# Patient Record
Sex: Male | Born: 1975 | Hispanic: Yes | Marital: Single | Smoking: Never smoker
Health system: Southern US, Community
[De-identification: ages and names within clinical notes are randomized; demographics above are authoritative.]

## PROBLEM LIST (undated history)

## (undated) DIAGNOSIS — L409 Psoriasis, unspecified: Secondary | ICD-10-CM

## (undated) DIAGNOSIS — J302 Other seasonal allergic rhinitis: Secondary | ICD-10-CM

## (undated) DIAGNOSIS — K219 Gastro-esophageal reflux disease without esophagitis: Secondary | ICD-10-CM

## (undated) HISTORY — PX: HERNIA REPAIR: SHX51

## (undated) HISTORY — DX: Psoriasis, unspecified: L40.9

---

## 2011-08-05 ENCOUNTER — Emergency Department: Payer: Self-pay | Admitting: Emergency Medicine

## 2011-08-05 LAB — CBC WITH DIFFERENTIAL/PLATELET
Basophil #: 0 10*3/uL (ref 0.0–0.1)
Eosinophil %: 2.4 %
HGB: 17.5 g/dL (ref 13.0–18.0)
MCH: 32.1 pg (ref 26.0–34.0)
MCV: 94 fL (ref 80–100)
Monocyte #: 0.4 x10 3/mm (ref 0.2–1.0)
Monocyte %: 5.5 %
Neutrophil #: 4.4 10*3/uL (ref 1.4–6.5)
Neutrophil %: 65.8 %
Platelet: 280 10*3/uL (ref 150–440)
WBC: 6.8 10*3/uL (ref 3.8–10.6)

## 2011-08-05 LAB — COMPREHENSIVE METABOLIC PANEL
Albumin: 4.7 g/dL (ref 3.4–5.0)
Alkaline Phosphatase: 106 U/L (ref 50–136)
BUN: 11 mg/dL (ref 7–18)
Bilirubin,Total: 0.9 mg/dL (ref 0.2–1.0)
Chloride: 105 mmol/L (ref 98–107)
Co2: 29 mmol/L (ref 21–32)
Osmolality: 279 (ref 275–301)
Potassium: 3.6 mmol/L (ref 3.5–5.1)
SGPT (ALT): 56 U/L
Sodium: 140 mmol/L (ref 136–145)

## 2011-08-05 LAB — LIPASE, BLOOD: Lipase: 121 U/L (ref 73–393)

## 2012-11-02 ENCOUNTER — Ambulatory Visit: Payer: Self-pay | Admitting: Internal Medicine

## 2016-02-05 DIAGNOSIS — K219 Gastro-esophageal reflux disease without esophagitis: Secondary | ICD-10-CM | POA: Insufficient documentation

## 2020-01-25 ENCOUNTER — Emergency Department: Payer: 59

## 2020-01-25 ENCOUNTER — Emergency Department
Admission: EM | Admit: 2020-01-25 | Discharge: 2020-01-25 | Disposition: A | Discharge status: Home / Self Care | Payer: 59 | Attending: Emergency Medicine | Admitting: Emergency Medicine

## 2020-01-25 ENCOUNTER — Encounter: Payer: Self-pay | Admitting: *Deleted

## 2020-01-25 ENCOUNTER — Other Ambulatory Visit: Payer: Self-pay

## 2020-01-25 DIAGNOSIS — Z20822 Contact with and (suspected) exposure to covid-19: Secondary | ICD-10-CM | POA: Insufficient documentation

## 2020-01-25 DIAGNOSIS — R059 Cough, unspecified: Secondary | ICD-10-CM | POA: Diagnosis not present

## 2020-01-25 DIAGNOSIS — R42 Dizziness and giddiness: Secondary | ICD-10-CM | POA: Insufficient documentation

## 2020-01-25 DIAGNOSIS — R0602 Shortness of breath: Secondary | ICD-10-CM | POA: Diagnosis not present

## 2020-01-25 DIAGNOSIS — Z79899 Other long term (current) drug therapy: Secondary | ICD-10-CM | POA: Diagnosis not present

## 2020-01-25 HISTORY — DX: Other seasonal allergic rhinitis: J30.2

## 2020-01-25 HISTORY — DX: Gastro-esophageal reflux disease without esophagitis: K21.9

## 2020-01-25 LAB — URINALYSIS, COMPLETE (UACMP) WITH MICROSCOPIC
Bacteria, UA: NONE SEEN
Bilirubin Urine: NEGATIVE
Glucose, UA: NEGATIVE mg/dL
Hgb urine dipstick: NEGATIVE
Ketones, ur: NEGATIVE mg/dL
Leukocytes,Ua: NEGATIVE
Nitrite: NEGATIVE
Protein, ur: NEGATIVE mg/dL
Specific Gravity, Urine: 1.008 (ref 1.005–1.030)
Squamous Epithelial / HPF: NONE SEEN (ref 0–5)
pH: 8 (ref 5.0–8.0)

## 2020-01-25 LAB — CBC
HCT: 46.6 % (ref 39.0–52.0)
Hemoglobin: 15.9 g/dL (ref 13.0–17.0)
MCH: 31.9 pg (ref 26.0–34.0)
MCHC: 34.1 g/dL (ref 30.0–36.0)
MCV: 93.4 fL (ref 80.0–100.0)
Platelets: 306 10*3/uL (ref 150–400)
RBC: 4.99 MIL/uL (ref 4.22–5.81)
RDW: 12.2 % (ref 11.5–15.5)
WBC: 6.8 10*3/uL (ref 4.0–10.5)
nRBC: 0 % (ref 0.0–0.2)

## 2020-01-25 LAB — BASIC METABOLIC PANEL
Anion gap: 9 (ref 5–15)
BUN: 14 mg/dL (ref 6–20)
CO2: 27 mmol/L (ref 22–32)
Calcium: 9.4 mg/dL (ref 8.9–10.3)
Chloride: 104 mmol/L (ref 98–111)
Creatinine, Ser: 1.35 mg/dL — ABNORMAL HIGH (ref 0.61–1.24)
GFR, Estimated: >60 mL/min (ref >60)
Glucose, Bld: 105 mg/dL — ABNORMAL HIGH (ref 70–99)
Potassium: 4 mmol/L (ref 3.5–5.1)
Sodium: 140 mmol/L (ref 135–145)

## 2020-01-25 LAB — GROUP A STREP BY PCR: Group A Strep by PCR: NOT DETECTED

## 2020-01-25 LAB — RESPIRATORY PANEL BY RT PCR (FLU A&B, COVID)
Influenza A by PCR: NEGATIVE
Influenza B by PCR: NEGATIVE
SARS Coronavirus 2 by RT PCR: NEGATIVE

## 2020-01-25 MED ORDER — AZITHROMYCIN 250 MG PO TABS: ORAL_TABLET | ORAL | 0 refills | Status: AC

## 2020-01-25 MED ORDER — DM-GUAIFENESIN ER 30-600 MG PO TB12: 1.0000 | ORAL_TABLET | Freq: Two times a day (BID) | ORAL | 0 refills | Status: DC

## 2020-01-25 MED ORDER — HYDROXYZINE HCL 10 MG PO TABS: 10.0000 mg | ORAL_TABLET | Freq: Three times a day (TID) | ORAL | 0 refills | Status: AC | PRN

## 2020-01-25 MED ORDER — FAMOTIDINE 20 MG PO TABS: 20.0000 mg | ORAL_TABLET | Freq: Two times a day (BID) | ORAL | 1 refills | Status: DC

## 2020-01-25 MED ORDER — PREDNISONE 50 MG PO TABS: ORAL_TABLET | ORAL | 0 refills | Status: DC

## 2020-01-25 NOTE — ED Triage Notes (Signed)
Pt c/o cough x 2 wks. Today c/o n/v x 1 and dizziness. Pt c/o pain in bilateral ears stuffy nose and sore throat x 3 days.

## 2020-01-25 NOTE — ED Notes (Signed)
1930 Patient brought back to room 43, A&Ox4, c/o acid reflux symptoms worsening x 24 hours.  Has been taking omeprazole for same without improvement.  Has slight burning in back of throat, some nausea.  Denies eating anything to trigger symptoms.  See triage.

## 2020-01-25 NOTE — Discharge Instructions (Signed)
You have been prescribed azithromycin, prednisone and Mucinex DM for cough. You have been prescribed Pepcid for acid reflux. You have been prescribed hydroxyzine for anxiety.

## 2020-01-25 NOTE — ED Provider Notes (Signed)
Emergency Department Provider Note  ____________________________________________  Time seen: Approximately 10:32 PM  I have reviewed the triage vital signs and the nursing notes.   HISTORY  Chief Complaint Cough and Dizziness   Historian Patient    HPI Clinton Zuniga is a 44 y.o. male presents to the emergency department with concern for cough for the past 2 weeks, pharyngitis and acid reflux. Patient is also requesting medication for anxiety. He denies chest pain, chest tightness or abdominal pain. No fever or chills at home. States that cough is keeping him awake at night. No sick contacts in the home.     Past Medical History:  Diagnosis Date  . Acid reflux   . Seasonal allergies      Immunizations up to date:  Yes.     Past Medical History:  Diagnosis Date  . Acid reflux   . Seasonal allergies     There are no problems to display for this patient.   Past Surgical History:  Procedure Laterality Date  . HERNIA REPAIR      Prior to Admission medications   Medication Sig Start Date End Date Taking? Authorizing Provider  azithromycin (ZITHROMAX Z-PAK) 250 MG tablet Take 2 tablets (500 mg) on  Day 1,  followed by 1 tablet (250 mg) once daily on Days 2 through 5. 01/25/20 01/30/20  Orvil Feil, PA-C  dextromethorphan-guaiFENesin [facility] DM) 30-600 MG 12hr tablet Take 1 tablet by mouth 2 (two) times daily. 01/25/20   Orvil Feil, PA-C  famotidine (PEPCID) 20 MG tablet Take 1 tablet (20 mg total) by mouth 2 (two) times daily for 10 days. 01/25/20 02/04/20  Orvil Feil, PA-C  hydrOXYzine (ATARAX/VISTARIL) 10 MG tablet Take 1 tablet (10 mg total) by mouth 3 (three) times daily as needed for up to 5 days. 01/25/20 01/30/20  Orvil Feil, PA-C  predniSONE (DELTASONE) 50 MG tablet Take one tablet daily for the next five days. 01/25/20   Orvil Feil, PA-C    Allergies Patient has no known allergies.  History reviewed. No pertinent family  history.  Social History Social History   Tobacco Use  . Smoking status: Never Smoker  . Smokeless tobacco: Never Used  Vaping Use  . Vaping Use: Never used  Substance Use Topics  . Alcohol use: Not Currently  . Drug use: Never     Review of Systems  Constitutional: No fever/chills Eyes:  No discharge ENT: No upper respiratory complaints. Respiratory: Patient has cough.  Gastrointestinal:   No nausea, no vomiting.  No diarrhea.  No constipation. Musculoskeletal: Negative for musculoskeletal pain. Skin: Negative for rash, abrasions, lacerations, ecchymosis.    ____________________________________________   PHYSICAL EXAM:  VITAL SIGNS: ED Triage Vitals  Enc Vitals Group     BP 01/25/20 1813 129/84     Pulse Rate 01/25/20 1813 80     Resp 01/25/20 1813 16     Temp 01/25/20 1813 98.1 F (36.7 C)     Temp Source 01/25/20 1813 Oral     SpO2 01/25/20 1813 99 %     Weight 01/25/20 1815 164 lb (74.4 kg)     Height 01/25/20 1815 5\' 1"  (1.549 m)     Head Circumference --      Peak Flow --      Pain Score 01/25/20 1814 4     Pain Loc --      Pain Edu? --      Excl. in GC? --  Constitutional: Alert and oriented. Patient is lying supine. Eyes: Conjunctivae are normal. PERRL. EOMI. Head: Atraumatic. ENT:      Ears: Tympanic membranes are mildly injected with mild effusion bilaterally.       Nose: No congestion/rhinnorhea.      Mouth/Throat: Mucous membranes are moist. Posterior pharynx is mildly erythematous.  Hematological/Lymphatic/Immunilogical: No cervical lymphadenopathy.  Cardiovascular: Normal rate, regular rhythm. Normal S1 and S2.  Good peripheral circulation. Respiratory: Normal respiratory effort without tachypnea or retractions. Lungs CTAB. Good air entry to the bases with no decreased or absent breath sounds. Gastrointestinal: Bowel sounds 4 quadrants. Soft and nontender to palpation. No guarding or rigidity. No palpable masses. No distention. No CVA  tenderness. Musculoskeletal: Full range of motion to all extremities. No gross deformities appreciated. Neurologic:  Normal speech and language. No gross focal neurologic deficits are appreciated.  Skin:  Skin is warm, dry and intact. No rash noted. Psychiatric: Mood and affect are normal. Speech and behavior are normal. Patient exhibits appropriate insight and judgement.    ____________________________________________   LABS (all labs ordered are listed, but only abnormal results are displayed)  Labs Reviewed  BASIC METABOLIC PANEL - Abnormal; Notable for the following components:      Result Value   Glucose, Bld 105 (*)    Creatinine, Ser 1.35 (*)    All other components within normal limits  URINALYSIS, COMPLETE (UACMP) WITH MICROSCOPIC - Abnormal; Notable for the following components:   Color, Urine STRAW (*)    APPearance CLEAR (*)    All other components within normal limits  GROUP A STREP BY PCR  RESPIRATORY PANEL BY RT PCR (FLU A&B, COVID)  CBC   ____________________________________________  EKG   ____________________________________________  RADIOLOGY Geraldo Pitter, personally viewed and evaluated these images (plain radiographs) as part of my medical decision making, as well as reviewing the written report by the radiologist.  DG Chest 2 View  Result Date: 01/25/2020 CLINICAL DATA:  Shortness of breath. EXAM: CHEST - 2 VIEW COMPARISON:  None. FINDINGS: The heart size and mediastinal contours are within normal limits. Both lungs are clear. The visualized skeletal structures are unremarkable. IMPRESSION: No active cardiopulmonary disease. Electronically Signed   By: Katherine Mantle M.D.   On: 01/25/2020 19:09    ____________________________________________    PROCEDURES  Procedure(s) performed:     Procedures     Medications - No data to display   ____________________________________________   INITIAL IMPRESSION / ASSESSMENT AND PLAN / ED  COURSE  Pertinent labs & imaging results that were available during my care of the patient were reviewed by me and considered in my medical decision making (see chart for details).      Assessment and Plan:  Bronchitis Acid reflux Anxiety 44 year old male presents to the emergency department with nonproductive cough for the past 2 weeks.  Vital signs were reassuring at triage. On physical exam, patient was calm, alert and nontoxic-appearing.  He had no increased work of breathing and no adventitious lung sounds were auscultated.  Patient was discharged with azithromycin and prednisone for likely bronchitis. He was discharged with hydroxyzine for anxiety and Pepcid for acid reflux. Return precautions were given to return with new or worsening symptoms. All patient questions were answered.  ____________________________________________  FINAL CLINICAL IMPRESSION(S) / ED DIAGNOSES  Final diagnoses:  Cough      NEW MEDICATIONS STARTED DURING THIS VISIT:  ED Discharge Orders         Ordered    predniSONE (  DELTASONE) 50 MG tablet        01/25/20 2023    azithromycin (ZITHROMAX Z-PAK) 250 MG tablet        01/25/20 2023    dextromethorphan-guaiFENesin (MUCINEX DM) 30-600 MG 12hr tablet  2 times daily        01/25/20 2023    hydrOXYzine (ATARAX/VISTARIL) 10 MG tablet  3 times daily PRN        01/25/20 2023    famotidine (PEPCID) 20 MG tablet  2 times daily        01/25/20 2024              This chart was dictated using voice recognition software/Dragon. Despite best efforts to proofread, errors can occur which can change the meaning. Any change was purely unintentional.     Orvil Feil, PA-C 01/25/20 2235    Shaune Pollack, MD 02/06/20 [phone]

## 2020-02-01 DIAGNOSIS — Z1389 Encounter for screening for other disorder: Secondary | ICD-10-CM | POA: Diagnosis not present

## 2020-02-01 DIAGNOSIS — J205 Acute bronchitis due to respiratory syncytial virus: Secondary | ICD-10-CM | POA: Diagnosis not present

## 2020-02-07 DIAGNOSIS — Z1389 Encounter for screening for other disorder: Secondary | ICD-10-CM | POA: Diagnosis not present

## 2020-02-07 DIAGNOSIS — Z113 Encounter for screening for infections with a predominantly sexual mode of transmission: Secondary | ICD-10-CM | POA: Diagnosis not present

## 2020-02-07 DIAGNOSIS — K219 Gastro-esophageal reflux disease without esophagitis: Secondary | ICD-10-CM | POA: Diagnosis not present

## 2020-02-07 DIAGNOSIS — Z23 Encounter for immunization: Secondary | ICD-10-CM | POA: Diagnosis not present

## 2020-02-07 DIAGNOSIS — L409 Psoriasis, unspecified: Secondary | ICD-10-CM | POA: Diagnosis not present

## 2020-02-07 DIAGNOSIS — Z1159 Encounter for screening for other viral diseases: Secondary | ICD-10-CM | POA: Diagnosis not present

## 2020-02-07 DIAGNOSIS — F411 Generalized anxiety disorder: Secondary | ICD-10-CM | POA: Diagnosis not present

## 2020-02-07 DIAGNOSIS — H8110 Benign paroxysmal vertigo, unspecified ear: Secondary | ICD-10-CM | POA: Diagnosis not present

## 2020-02-07 DIAGNOSIS — Z6828 Body mass index (BMI) 28.0-28.9, adult: Secondary | ICD-10-CM | POA: Diagnosis not present

## 2020-02-07 DIAGNOSIS — R0789 Other chest pain: Secondary | ICD-10-CM | POA: Diagnosis not present

## 2020-02-22 DIAGNOSIS — Z1389 Encounter for screening for other disorder: Secondary | ICD-10-CM | POA: Diagnosis not present

## 2020-02-22 DIAGNOSIS — F411 Generalized anxiety disorder: Secondary | ICD-10-CM | POA: Diagnosis not present

## 2020-03-03 DIAGNOSIS — R61 Generalized hyperhidrosis: Secondary | ICD-10-CM | POA: Diagnosis not present

## 2020-03-03 DIAGNOSIS — L409 Psoriasis, unspecified: Secondary | ICD-10-CM | POA: Diagnosis not present

## 2020-03-15 DIAGNOSIS — Z23 Encounter for immunization: Secondary | ICD-10-CM | POA: Diagnosis not present

## 2020-03-15 DIAGNOSIS — R7303 Prediabetes: Secondary | ICD-10-CM | POA: Diagnosis not present

## 2020-04-24 DIAGNOSIS — Z20822 Contact with and (suspected) exposure to covid-19: Secondary | ICD-10-CM | POA: Diagnosis not present

## 2020-04-25 DIAGNOSIS — Z20822 Contact with and (suspected) exposure to covid-19: Secondary | ICD-10-CM | POA: Diagnosis not present

## 2020-07-26 ENCOUNTER — Other Ambulatory Visit: Payer: Self-pay

## 2020-07-26 ENCOUNTER — Ambulatory Visit: Payer: 59 | Admitting: Dermatology

## 2020-07-26 ENCOUNTER — Encounter: Payer: Self-pay | Admitting: Dermatology

## 2020-07-26 DIAGNOSIS — L409 Psoriasis, unspecified: Secondary | ICD-10-CM

## 2020-07-26 DIAGNOSIS — B079 Viral wart, unspecified: Secondary | ICD-10-CM | POA: Diagnosis not present

## 2020-07-26 DIAGNOSIS — L74513 Primary focal hyperhidrosis, soles: Secondary | ICD-10-CM

## 2020-07-26 DIAGNOSIS — L74519 Primary focal hyperhidrosis, unspecified: Secondary | ICD-10-CM

## 2020-07-26 MED ORDER — CLOBETASOL PROPIONATE EMULSION 0.05 % EX FOAM: CUTANEOUS | 1 refills | Status: DC

## 2020-07-26 NOTE — Patient Instructions (Addendum)
Psoriasis is a chronic non-curable, but treatable genetic/hereditary disease that may have other systemic features affecting other organ systems such as joints (Psoriatic Arthritis). It is associated with an increased risk of inflammatory bowel disease, heart disease, non-alcoholic fatty liver disease, and depression.      Side effects of Otezla (apremilast) include diarrhea, nausea, headache, upper respiratory infection, depression, and weight decrease (5-10%). It should only be taken by pregnant women after a discussion regarding risks and benefits with their doctor. Goal is control of skin condition, not cure.  The use of [place] requires long term medication management, including periodic office visits.   If you have any questions or concerns for your doctor, please call our main line at [phone] and press option 4 to reach your doctor's medical assistant. If no one answers, please leave a voicemail as directed and we will return your call as soon as possible. Messages left after 4 pm will be answered the following business day.   You may also send [place] a message via MyChart. We typically respond to MyChart messages within 1-2 business days.  For prescription refills, please ask your pharmacy to contact our office. Our fax number is [phone].  If you have an urgent issue when the clinic is closed that cannot wait until the next business day, you can page your doctor at the number below.    Please note that while we do our best to be available for urgent issues outside of office hours, we are not available 24/7.   If you have an urgent issue and are unable to reach [place], you may choose to seek medical care at your doctor's office, retail clinic, urgent care center, or emergency room.  If you have a medical emergency, please immediately call 911 or go to the emergency department.  Pager Numbers  - Dr. Gwen Pounds: [phone]  - Dr. Neale Burly: [phone]  - Dr. Roseanne Reno: [phone]  In  the event of inclement weather, please call our main line at [phone] for an update on the status of any delays or closures.  Dermatology Medication Tips: Please keep the boxes that topical medications come in in order to help keep track of the instructions about where and how to use these. Pharmacies typically print the medication instructions only on the boxes and not directly on the medication tubes.   If your medication is too expensive, please contact our office at [phone] option 4 or send [place] a message through MyChart.   We are unable to tell what your co-pay for medications will be in advance as this is different depending on your insurance coverage. However, we may be able to find a substitute medication at lower cost or fill out paperwork to get insurance to cover a needed medication.   If a prior authorization is required to get your medication covered by your insurance company, please allow [place] 1-2 business days to complete this process.  Drug prices often vary depending on where the prescription is filled and some pharmacies may offer cheaper prices.  The website www.goodrx.com contains coupons for medications through different pharmacies. The prices here do not account for what the cost may be with help from insurance (it may be cheaper with your insurance), but the website can give you the price if you did not use any insurance.  - You can print the associated coupon and take it with your prescription to the pharmacy.  - You may also stop by our office during regular business hours and pick up  a GoodRx coupon card.  - If you need your prescription sent electronically to a different pharmacy, notify our office through [facility] or by phone at [phone] option 4.    Try Carpe Lotion daily to feet or Zero Sweat daily to feet. If no improvement can consider prescription.   Cantharidin Plus is a blistering agent that comes from a beetle.  It needs to be washed off  in about 4 hours after application.  Although it is painless when applied in office, it may cause symptoms of mild pain and burning several hours later.  Treated areas will swell and turn red, and blisters may form.  Vaseline and a bandaid may be applied until wound has healed.  Once healed, the skin may remain temporarily discolored.  It can take weeks to months for pigmentation to return to normal.  Advised to wash off with soap and water in 4 hours or sooner if it becomes tender before then.

## 2020-07-26 NOTE — Progress Notes (Signed)
New Patient Visit  Subjective  Clinton Zuniga is a 45 y.o. male who presents for the following: Psoriasis (New patient is here today for psoriasis. He states he has been dealing with condition for about 7 years. He currently using clobetasol solution and triamcinolone cream to help. He denies that cream and solution is helping. He reports he is having some back pain. Patient states he has areas on scalp, back, arms, legs, and abdomen. ).  Patient would also like to discuss mole on left side near nose. New in last few months.  He would also like to discuss excess sweating of feet for years. He has tried OTC antifungals and some other product but does not know the name.   Objective  Well appearing patient in no apparent distress; mood and affect are within normal limits.  A focused examination was performed including face, neck, chest, abdomen, back, arms, legs, scalp. Relevant physical exam findings are noted in the Assessment and Plan.  Objective  back, buttocks, scalp, arms, abdomen, and legs: Thick scaly plaques on back, buttocks, scalp, arms, abdomen, and legs   Objective  Left Buccal Cheek: Verrucous papules -- Discussed viral etiology and contagion.   Assessment & Plan  Psoriasis back, buttocks, scalp, arms, abdomen, and legs  Psoriasis is a chronic non-curable, but treatable genetic/hereditary disease that may have other systemic features affecting other organ systems such as joints (Psoriatic Arthritis). It is associated with an increased risk of inflammatory bowel disease, heart disease, non-alcoholic fatty liver disease, and depression.    Chronic condition with duration over one year. Condition is bothersome to patient. Currently flared.  Denies joint pain today  Has history of depression on Sertraline and doing well.   Difficult to come for phototherapy multiple times a week, will start [place] since he has failed topical steroids and has BSA approx  20%.  Side effects of Otezla (apremilast) include diarrhea, nausea, headache, upper respiratory infection, depression, and weight decrease (5-10%). It should only be taken by pregnant women after a discussion regarding risks and benefits with their doctor. Goal is control of skin condition, not cure.  The use of [place] requires long term medication management, including periodic office visits.  Submit for approval will start with 2 sample packs today    [id number] 28 Sep 2021  Also start clobetasol foam to affected areas apply twice daily as needed to affected areas for two weeks then apply twice daily on weekends only. Avoid applying to face, groin, and axilla. Use as directed. Risk of skin atrophy with long-term use reviewed.   Topical steroids (such as triamcinolone, fluocinolone, fluocinonide, mometasone, clobetasol, halobetasol, betamethasone, hydrocortisone) can cause thinning and lightening of the skin if they are used for too long in the same area. Your physician has selected the right strength medicine for your problem and area affected on the body. Please use your medication only as directed by your physician to prevent side effects.        Viral warts, unspecified type Left Buccal Cheek  Discussed treatment options  Discussed viral etiology and risk of spread.  Discussed multiple treatments may be required to clear warts.  Discussed possible post-treatment dyspigmentation and risk of recurrence.   Cantharidin Plus is a blistering agent that comes from a beetle.  It needs to be washed off in about 4 hours after application.  Although it is painless when applied in office, it may cause symptoms of mild pain and burning several hours later.  Treated areas  will swell and turn red, and blisters may form.  Vaseline and a bandaid may be applied until wound has healed.  Once healed, the skin may remain temporarily discolored.  It can take weeks to months for pigmentation to return to  normal.  Advised to wash off with soap and water in 4 hours or sooner if it becomes tender before then.    Destruction of lesion - Left Buccal Cheek  Destruction method: chemical removal   Informed consent: discussed and consent obtained   Timeout:  patient name, date of birth, surgical site, and procedure verified Chemical destruction method: cantharidin   Chemical destruction method comment:  Plus Application time:  4 hours Procedure instructions: patient instructed to wash and dry area   Outcome: patient tolerated procedure well with no complications   Post-procedure details: wound care instructions given    Primary focal hyperhidrosis Left Foot - Anterior  Recommend trial of Carpe lotion and/or Zero Sweat.   Will consider qbrexza if doesn't respond to carpe lotion or zero sweat   Return in about 6 weeks (around 09/06/2020) for psoriasis .  I, Asher Muir, CMA, am acting as scribe for Darden Dates, MD.  Documentation: I have reviewed the above documentation for accuracy and completeness, and I agree with the above.  Darden Dates, MD

## 2020-07-27 MED ORDER — OTEZLA 30 MG PO TABS: 30.0000 mg | ORAL_TABLET | Freq: Two times a day (BID) | ORAL | 5 refills | Status: DC

## 2020-07-27 MED ORDER — OTEZLA 30 MG PO TABS: 30.0000 mg | ORAL_TABLET | Freq: Two times a day (BID) | ORAL | 12 refills | Status: DC

## 2020-07-27 NOTE — Addendum Note (Signed)
Addended by: Asher Muir A on: 07/27/2020 08:09 AM   Modules accepted: Orders

## 2020-09-07 ENCOUNTER — Other Ambulatory Visit: Payer: Self-pay

## 2020-09-07 ENCOUNTER — Ambulatory Visit: Payer: 59 | Admitting: Dermatology

## 2020-09-07 DIAGNOSIS — L409 Psoriasis, unspecified: Secondary | ICD-10-CM

## 2020-09-07 NOTE — Patient Instructions (Addendum)
Continue clobetasol twice daily on weekends only. Avoid applying to face, groin, and axilla. Use as directed. Risk of skin atrophy with long-term use reviewed.  Discontinue triamcinolone.   Topical steroids (such as triamcinolone, fluocinolone, fluocinonide, mometasone, clobetasol, halobetasol, betamethasone, hydrocortisone) can cause thinning and lightening of the skin if they are used for too long in the same area. Your physician has selected the right strength medicine for your problem and area affected on the body. Please use your medication only as directed by your physician to prevent side effects.    If you have any questions or concerns for your doctor, please call our main line at [phone] and press option 4 to reach your doctor's medical assistant. If no one answers, please leave a voicemail as directed and we will return your call as soon as possible. Messages left after 4 pm will be answered the following business day.   You may also send [place] a message via MyChart. We typically respond to MyChart messages within 1-2 business days.  For prescription refills, please ask your pharmacy to contact our office. Our fax number is [phone].  If you have an urgent issue when the clinic is closed that cannot wait until the next business day, you can page your doctor at the number below.    Please note that while we do our best to be available for urgent issues outside of office hours, we are not available 24/7.   If you have an urgent issue and are unable to reach [place], you may choose to seek medical care at your doctor's office, retail clinic, urgent care center, or emergency room.  If you have a medical emergency, please immediately call 911 or go to the emergency department.  Pager Numbers  - Dr. Gwen Pounds: [phone]  - Dr. Neale Burly: [phone]  - Dr. Roseanne Reno: [phone]  In the event of inclement weather, please call our main line at [phone] for an update on the status of  any delays or closures.  Dermatology Medication Tips: Please keep the boxes that topical medications come in in order to help keep track of the instructions about where and how to use these. Pharmacies typically print the medication instructions only on the boxes and not directly on the medication tubes.   If your medication is too expensive, please contact our office at [phone] option 4 or send [place] a message through MyChart.   We are unable to tell what your co-pay for medications will be in advance as this is different depending on your insurance coverage. However, we may be able to find a substitute medication at lower cost or fill out paperwork to get insurance to cover a needed medication.   If a prior authorization is required to get your medication covered by your insurance company, please allow [place] 1-2 business days to complete this process.  Drug prices often vary depending on where the prescription is filled and some pharmacies may offer cheaper prices.  The website www.goodrx.com contains coupons for medications through different pharmacies. The prices here do not account for what the cost may be with help from insurance (it may be cheaper with your insurance), but the website can give you the price if you did not use any insurance.  - You can print the associated coupon and take it with your prescription to the pharmacy.  - You may also stop by our office during regular business hours and pick up a GoodRx coupon card.  - If you need your prescription sent  electronically to a different pharmacy, notify our office through [facility] or by phone at [phone] option 4.

## 2020-09-07 NOTE — Progress Notes (Signed)
   Follow-Up Visit   Subjective  Clinton Zuniga is a 45 y.o. male who presents for the following: Psoriasis (Patient here today for 6 week psoriasis follow up. He started [place] samples but is only taking one a day at bedtime due to nausea. Patient has not picked up clobetasol foam yet and is using clobetasol solution and TMC 0.1% cream that he already had. ).  Patient thinks psoriasis may have improved a little.   The following portions of the chart were reviewed this encounter and updated as appropriate:   Tobacco  Allergies  Meds  Problems  Med Hx  Surg Hx  Fam Hx       Review of Systems:  No other skin or systemic complaints except as noted in HPI or Assessment and Plan.  Objective  Well appearing patient in no apparent distress; mood and affect are within normal limits.  A focused examination was performed including face, neck, chest and back and arms. Relevant physical exam findings are noted in the Assessment and Plan.  Abdomen (Lower Torso, Anterior) Widespread scaly pink plaques chest, back, arms  BSA > 10%   Assessment & Plan  Psoriasis Abdomen (Lower Torso, Anterior)  Chronic condition with duration over one year. Condition is bothersome to patient. Not currently at goal.  Continue clobetasol twice daily on weekends. Avoid applying to face, groin, and axilla. Use as directed. Risk of skin atrophy with long-term use reviewed.  D/c TMC 0.1%  D/C Otezla due to side effects  Stat NVUVB twice weekly. If too expensive will submit for [facility].   Topical steroids (such as triamcinolone, fluocinolone, fluocinonide, mometasone, clobetasol, halobetasol, betamethasone, hydrocortisone) can cause thinning and lightening of the skin if they are used for too long in the same area. Your physician has selected the right strength medicine for your problem and area affected on the body. Please use your medication only as directed by your physician to prevent side effects.    Patient treated in lightbox for 2 minutes, 20 seconds.   Related Procedures Phototherapy Treatment  Return in about 3 months (around 12/08/2020) for Psoriasis.  Anise Salvo, RMA, am acting as scribe for Darden Dates, MD .  Documentation: I have reviewed the above documentation for accuracy and completeness, and I agree with the above.  Darden Dates, MD

## 2020-09-20 ENCOUNTER — Encounter: Payer: Self-pay | Admitting: Dermatology

## 2021-01-03 ENCOUNTER — Other Ambulatory Visit: Payer: Self-pay

## 2021-01-03 ENCOUNTER — Ambulatory Visit: Payer: 59 | Admitting: Dermatology

## 2021-01-03 DIAGNOSIS — L409 Psoriasis, unspecified: Secondary | ICD-10-CM

## 2021-01-03 MED ORDER — HYDROXYZINE HCL 25 MG PO TABS: ORAL_TABLET | ORAL | 0 refills | Status: DC

## 2021-01-03 MED ORDER — VTAMA 1 % EX CREA: 1.0000 "application " | TOPICAL_CREAM | Freq: Every day | CUTANEOUS | 2 refills | Status: DC

## 2021-01-03 MED ORDER — WYNZORA 0.005-0.064 % EX CREA: 1.0000 "application " | TOPICAL_CREAM | Freq: Every day | CUTANEOUS | 1 refills | Status: DC

## 2021-01-03 NOTE — Patient Instructions (Signed)
Reviewed risks of biologics including immunosuppression, infections, injection site reaction, and failure to improve condition. Goal is control of skin condition, not cure.  Some older biologics such as Humira and Enbrel may slightly increase risk of malignancy and may worsen congestive heart failure. The use of biologics requires long term medication management, including periodic office visits and monitoring of blood work.    If you have any questions or concerns for your doctor, please call our main line at [phone] and press option 4 to reach your doctor's medical assistant. If no one answers, please leave a voicemail as directed and we will return your call as soon as possible. Messages left after 4 pm will be answered the following business day.   You may also send us a message via MyChart. We typically respond to MyChart messages within 1-2 business days.  For prescription refills, please ask your pharmacy to contact our office. Our fax number is [phone].  If you have an urgent issue when the clinic is closed that cannot wait until the next business day, you can page your doctor at the number below.    Please note that while we do our best to be available for urgent issues outside of office hours, we are not available 24/7.   If you have an urgent issue and are unable to reach us, you may choose to seek medical care at your doctor's office, retail clinic, urgent care center, or emergency room.  If you have a medical emergency, please immediately call 911 or go to the emergency department.  Pager Numbers  - Dr. Kowalski: [phone]  - Dr. Moye: [phone]  - Dr. Stewart: [phone]  In the event of inclement weather, please call our main line at [phone] for an update on the status of any delays or closures.  Dermatology Medication Tips: Please keep the boxes that topical medications come in in order to help keep track of the instructions about where and how to  use these. Pharmacies typically print the medication instructions only on the boxes and not directly on the medication tubes.   If your medication is too expensive, please contact our office at [phone] option 4 or send us a message through MyChart.   We are unable to tell what your co-pay for medications will be in advance as this is different depending on your insurance coverage. However, we may be able to find a substitute medication at lower cost or fill out paperwork to get insurance to cover a needed medication.   If a prior authorization is required to get your medication covered by your insurance company, please allow us 1-2 business days to complete this process.  Drug prices often vary depending on where the prescription is filled and some pharmacies may offer cheaper prices.  The website www.goodrx.com contains coupons for medications through different pharmacies. The prices here do not account for what the cost may be with help from insurance (it may be cheaper with your insurance), but the website can give you the price if you did not use any insurance.  - You can print the associated coupon and take it with your prescription to the pharmacy.  - You may also stop by our office during regular business hours and pick up a GoodRx coupon card.  - If you need your prescription sent electronically to a different pharmacy, notify our office through [facility] MyChart or by phone at [phone] option 4.  

## 2021-01-03 NOTE — Progress Notes (Signed)
   Follow-Up Visit   Subjective  Clinton Zuniga is a 45 y.o. male who presents for the following: Psoriasis (Patient here today for 3 month psoriasis follow up. Patient was not able to tolerate Otezla due to side effects. Patient has used TMC 0.1% cream and clobetasol but neither has helped. Patient advises his psoriasis has been extremely itchy, is spreading and keeps him awake.).   The following portions of the chart were reviewed this encounter and updated as appropriate:   Tobacco  Allergies  Meds  Problems  Med Hx  Surg Hx  Fam Hx      Review of Systems:  No other skin or systemic complaints except as noted in HPI or Assessment and Plan.  Objective  Well appearing patient in no apparent distress; mood and affect are within normal limits.  A focused examination was performed including face, neck, chest and back and arms, legs. Relevant physical exam findings are noted in the Assessment and Plan.  trunk, extremities Widespread, moderately thick, scaly pink plaques over trunk and extremities with scalp and face involvement    Assessment & Plan  Psoriasis trunk, extremities  Chronic condition with duration or expected duration over one year. Condition is bothersome to patient. Currently flared.  Psoriasis is a chronic non-curable, but treatable genetic/hereditary disease that may have other systemic features affecting other organ systems such as joints (Psoriatic Arthritis). It is associated with an increased risk of inflammatory bowel disease, heart disease, non-alcoholic fatty liver disease, and depression.    BSA 20%  Did not tolerate Otezla. No help with topical clobetasol and TMC. Unable to be approved for phototherapy.  Pending labs will submit for Skyrizi.  Start Vtama daily to affected areas. Samples x 2. Lot # [id number]  Exp: 12/2021 Start Eldred Manges daily to affected areas. Avoid applying to face, groin, and axilla. Use as directed. Risk of skin atrophy with  long-term use reviewed.  Samples x 2  Lot # [id number]  Exp: 05/2022  Topical steroids (such as triamcinolone, fluocinolone, fluocinonide, mometasone, clobetasol, halobetasol, betamethasone, hydrocortisone) can cause thinning and lightening of the skin if they are used for too long in the same area. Your physician has selected the right strength medicine for your problem and area affected on the body. Please use your medication only as directed by your physician to prevent side effects.      Tapinarof (VTAMA) 1 % CREA - trunk, extremities Apply 1 application topically daily.  Calcipotriene-Betameth Diprop [facility]) 0.005-0.064 % CREA - trunk, extremities Apply 1 application topically daily. Avoid applying to face, groin, and axilla. Use as directed. Risk of skin atrophy with long-term use reviewed.  hydrOXYzine (ATARAX/VISTARIL) 25 MG tablet - trunk, extremities 1-2 tabs every 6 hours as needed for itch. May make drowsy.  Related Procedures Comprehensive metabolic panel Lipid panel CBC with Differential/Platelet Hepatitis B surface antigen Hepatitis B surface antibody,qualitative Hepatitis C antibody HIV Antibody (routine testing w rflx) QuantiFERON-TB Gold Plus Hepatitis B core antibody, total  Return in about 5 weeks (around 02/07/2021) for Psoriasis.  Anise Salvo, RMA, am acting as scribe for Darden Dates, MD .  Documentation: I have reviewed the above documentation for accuracy and completeness, and I agree with the above.  Darden Dates, MD

## 2021-01-04 DIAGNOSIS — L409 Psoriasis, unspecified: Secondary | ICD-10-CM | POA: Diagnosis not present

## 2021-01-05 ENCOUNTER — Other Ambulatory Visit: Payer: Self-pay | Admitting: Dermatology

## 2021-01-05 DIAGNOSIS — L400 Psoriasis vulgaris: Secondary | ICD-10-CM

## 2021-01-05 LAB — COMPREHENSIVE METABOLIC PANEL
ALT: 37 IU/L (ref 0–44)
AST: 27 IU/L (ref 0–40)
Albumin/Globulin Ratio: 2 (ref 1.2–2.2)
Albumin: 5.1 g/dL — ABNORMAL HIGH (ref 4.0–5.0)
Alkaline Phosphatase: 105 IU/L (ref 44–121)
BUN/Creatinine Ratio: 15 (ref 9–20)
BUN: 17 mg/dL (ref 6–24)
Bilirubin Total: 0.6 mg/dL (ref 0.0–1.2)
CO2: 25 mmol/L (ref 20–29)
Calcium: 10 mg/dL (ref 8.7–10.2)
Chloride: 101 mmol/L (ref 96–106)
Creatinine, Ser: 1.17 mg/dL (ref 0.76–1.27)
Globulin, Total: 2.6 g/dL (ref 1.5–4.5)
Glucose: 90 mg/dL (ref 70–99)
Potassium: 4.7 mmol/L (ref 3.5–5.2)
Sodium: 141 mmol/L (ref 134–144)
Total Protein: 7.7 g/dL (ref 6.0–8.5)
eGFR: 78 mL/min/{1.73_m2} (ref >59)

## 2021-01-05 LAB — CBC WITH DIFFERENTIAL/PLATELET
Basophils Absolute: 0 10*3/uL (ref 0.0–0.2)
Basos: 1 %
EOS (ABSOLUTE): 0.3 10*3/uL (ref 0.0–0.4)
Eos: 5 %
Hematocrit: 47.6 % (ref 37.5–51.0)
Hemoglobin: 16.2 g/dL (ref 13.0–17.7)
Immature Grans (Abs): 0 10*3/uL (ref 0.0–0.1)
Immature Granulocytes: 1 %
Lymphocytes Absolute: 2 10*3/uL (ref 0.7–3.1)
Lymphs: 31 %
MCH: 31.6 pg (ref 26.6–33.0)
MCHC: 34 g/dL (ref 31.5–35.7)
MCV: 93 fL (ref 79–97)
Monocytes Absolute: 0.4 10*3/uL (ref 0.1–0.9)
Monocytes: 6 %
Neutrophils Absolute: 3.7 10*3/uL (ref 1.4–7.0)
Neutrophils: 56 %
Platelets: 341 10*3/uL (ref 150–450)
RBC: 5.12 x10E6/uL (ref 4.14–5.80)
RDW: 12.3 % (ref 11.6–15.4)
WBC: 6.5 10*3/uL (ref 3.4–10.8)

## 2021-01-05 LAB — HEPATITIS C ANTIBODY: Hep C Virus Ab: <0.1 s/co ratio (ref 0.0–0.9)

## 2021-01-05 LAB — LIPID PANEL
Chol/HDL Ratio: 5.2 ratio — ABNORMAL HIGH (ref 0.0–5.0)
Cholesterol, Total: 186 mg/dL (ref 100–199)
HDL: 36 mg/dL — ABNORMAL LOW (ref >39)
LDL Chol Calc (NIH): 112 mg/dL — ABNORMAL HIGH (ref 0–99)
Triglycerides: 215 mg/dL — ABNORMAL HIGH (ref 0–149)
VLDL Cholesterol Cal: 38 mg/dL (ref 5–40)

## 2021-01-05 LAB — HEPATITIS B SURFACE ANTIGEN: Hepatitis B Surface Ag: NEGATIVE

## 2021-01-05 LAB — HEPATITIS B SURFACE ANTIBODY,QUALITATIVE: Hep B Surface Ab, Qual: NONREACTIVE

## 2021-01-05 LAB — HIV ANTIBODY (ROUTINE TESTING W REFLEX): HIV Screen 4th Generation wRfx: NONREACTIVE

## 2021-01-05 NOTE — Progress Notes (Signed)
Resent lab orders to [facility]

## 2021-01-08 ENCOUNTER — Telehealth: Payer: Self-pay

## 2021-01-08 NOTE — Telephone Encounter (Signed)
-----   Message from Sandi Mealy, MD sent at 01/05/2021 11:37 AM EDT ----- Labs ok so far. However, there was an issue with electronic ordering and [facility] did not receive the second paget of the lab order which included hepatitis B core antibody and quantiferon gold tests, so unfortunately he will have to go back for another blood draw.   I did resend those two orders and confirm with [facility] that they received them.    He does have slightly elevated cholesterol and I recommend seeing primary care for this.  MAs please call and ask him to go back for the repeat lab draw. Thank you!

## 2021-01-11 ENCOUNTER — Telehealth: Payer: Self-pay

## 2021-01-11 NOTE — Telephone Encounter (Signed)
Left pt message to call back for lab results.

## 2021-01-15 ENCOUNTER — Encounter: Payer: Self-pay | Admitting: Dermatology

## 2021-01-15 ENCOUNTER — Telehealth: Payer: Self-pay | Admitting: Dermatology

## 2021-01-15 NOTE — Telephone Encounter (Signed)
Left voicemail asking patient to go by lab again to have additional bloodwork. Unfortunately [facility] did not receive the second page of the orders for bloodwork and so 2 tests were not done. I spoke with [facility] and resent the orders which they confirmed they received.

## 2021-01-16 DIAGNOSIS — L400 Psoriasis vulgaris: Secondary | ICD-10-CM | POA: Diagnosis not present

## 2021-01-21 LAB — QUANTIFERON-TB GOLD PLUS
QuantiFERON Mitogen Value: >10 IU/mL
QuantiFERON Nil Value: 0.2 IU/mL
QuantiFERON TB1 Ag Value: 3.2 IU/mL
QuantiFERON TB2 Ag Value: 3.04 IU/mL
QuantiFERON-TB Gold Plus: POSITIVE — AB

## 2021-01-21 LAB — HEPATITIS B CORE ANTIBODY, TOTAL: Hep B Core Total Ab: NEGATIVE

## 2021-01-24 ENCOUNTER — Telehealth: Payer: Self-pay

## 2021-01-24 NOTE — Telephone Encounter (Addendum)
Tried calling patient to advise him to go back and repeat lab draw. No answer and unable to leave message.     ----- Message from Sandi Mealy, MD sent at 01/05/2021 11:37 AM EDT ----- Labs ok so far. However, there was an issue with electronic ordering and [facility] did not receive the second paget of the lab order which included hepatitis B core antibody and quantiferon gold tests, so unfortunately he will have to go back for another blood draw.   I did resend those two orders and confirm with [facility] that they received them.    He does have slightly elevated cholesterol and I recommend seeing primary care for this.  MAs please call and ask him to go back for the repeat lab draw. Thank you!

## 2021-01-25 ENCOUNTER — Telehealth: Payer: Self-pay

## 2021-01-25 NOTE — Telephone Encounter (Signed)
-----   Message from Sandi Mealy, MD sent at 01/24/2021  1:44 PM EDT ----- Quantiferon gold test positive which means that patient may have caught the tuberculosis bacteria which can cause a lung infection. Tuberculosis can be active (causing symptoms like cough, night sweats, or fever) or latent (not causing any symptoms, but still present in the body and could become active, particularly if starting some medications like biologics for psoriasis).  He needs to go to the health department for further evaluation to determine if he has latent or active tuberculosis, and likely to start treatment to get rid of the tuberculosis bacteria before we can start a biologic for his psoriasis.   Completely aside from psoriasis treatment, I recommend he get this evaluated and treated if they find he does have tuberculosis to prevent active tuberculosis.  MAs please call with interpreter. Thank you!

## 2021-01-25 NOTE — Telephone Encounter (Signed)
Called patient with interpreter but had to leave message for patient to call office for lab results.Clinton Zuniga

## 2021-02-07 ENCOUNTER — Other Ambulatory Visit: Payer: Self-pay

## 2021-02-07 ENCOUNTER — Ambulatory Visit: Payer: 59 | Admitting: Dermatology

## 2021-02-07 DIAGNOSIS — L409 Psoriasis, unspecified: Secondary | ICD-10-CM | POA: Diagnosis not present

## 2021-02-07 NOTE — Progress Notes (Signed)
   Follow-Up Visit   Subjective  Clinton Zuniga is a 45 y.o. male who presents for the following: Psoriasis (Patient here today for psoriasis follow up. Patient is currently using Vtama and Eldred Manges and is using them once daily. Patient advises his psoriasis has improved with topicals. ).  Patient had labs done 10/6 showing a + quantiferon gold result but we have not been able to contact patient to review.  The following portions of the chart were reviewed this encounter and updated as appropriate:   Tobacco  Allergies  Meds  Problems  Med Hx  Surg Hx  Fam Hx      Review of Systems:  No other skin or systemic complaints except as noted in HPI or Assessment and Plan.  Objective  Well appearing patient in no apparent distress; mood and affect are within normal limits.  A focused examination was performed including arms, abdomen. Relevant physical exam findings are noted in the Assessment and Plan.  trunk, extremities Multiple pink papules and -plaques with micaceous scale within hypopigmented patches   Assessment & Plan  Psoriasis trunk, extremities  Chronic condition with duration over one year. Condition is bothersome to patient. Currently flared despite topical therapy.  Psoriasis is a chronic non-curable, but treatable genetic/hereditary disease that may have other systemic features affecting other organ systems such as joints (Psoriatic Arthritis). It is associated with an increased risk of inflammatory bowel disease, heart disease, non-alcoholic fatty liver disease, and depression.    Denies joint pain Pt could not tolerate Otezla Pt could not get approved for nbUVB. We had planned to start [facility] but patient had positive quantiferon gold. We were unable to reach patient previously. Discussed result today - Pt to go to health department for further evaluation and treatment of latent TB if indicated.   Discussed treating with Sotyktu vs Skyrizi after  evaluation/treatment at health department  Samples of Zoryve x 3 given to patient to try once daily.  Lot # [id number]  Exp: 07/2022 Continue Eldred Manges once daily. Continue Vtama once daily.   Related Medications Tapinarof (VTAMA) 1 % CREA Apply 1 application topically daily.  Calcipotriene-Betameth Diprop (WYNZORA) 0.005-0.064 % CREA Apply 1 application topically daily. Avoid applying to face, groin, and axilla. Use as directed. Risk of skin atrophy with long-term use reviewed.  hydrOXYzine (ATARAX/VISTARIL) 25 MG tablet 1-2 tabs every 6 hours as needed for itch. May make drowsy.  Return in about 2 months (around 04/09/2021) for Psoriasis.  Anise Salvo, RMA, am acting as scribe for Darden Dates, MD .  Documentation: I have reviewed the above documentation for accuracy and completeness, and I agree with the above.  Darden Dates, MD

## 2021-02-07 NOTE — Patient Instructions (Signed)

## 2021-02-12 ENCOUNTER — Telehealth: Payer: Self-pay

## 2021-02-12 NOTE — Telephone Encounter (Signed)
-----   Message from Sandi Mealy, MD sent at 01/24/2021  1:44 PM EDT ----- Quantiferon gold test positive which means that patient may have caught the tuberculosis bacteria which can cause a lung infection. Tuberculosis can be active (causing symptoms like cough, night sweats, or fever) or latent (not causing any symptoms, but still present in the body and could become active, particularly if starting some medications like biologics for psoriasis).  He needs to go to the health department for further evaluation to determine if he has latent or active tuberculosis, and likely to start treatment to get rid of the tuberculosis bacteria before we can start a biologic for his psoriasis.   Completely aside from psoriasis treatment, I recommend he get this evaluated and treated if they find he does have tuberculosis to prevent active tuberculosis.  MAs please call with interpreter. Thank you!

## 2021-02-12 NOTE — Telephone Encounter (Signed)
-----   Message from Sandi Mealy, MD sent at 01/05/2021 11:37 AM EDT ----- Labs ok so far. However, there was an issue with electronic ordering and [facility] did not receive the second paget of the lab order which included hepatitis B core antibody and quantiferon gold tests, so unfortunately he will have to go back for another blood draw.   I did resend those two orders and confirm with [facility] that they received them.    He does have slightly elevated cholesterol and I recommend seeing primary care for this.  MAs please call and ask him to go back for the repeat lab draw. Thank you!

## 2021-02-13 ENCOUNTER — Encounter: Payer: Self-pay | Admitting: Dermatology

## 2021-04-12 ENCOUNTER — Ambulatory Visit: Payer: 59 | Admitting: Dermatology

## 2021-05-11 DIAGNOSIS — K219 Gastro-esophageal reflux disease without esophagitis: Secondary | ICD-10-CM | POA: Diagnosis not present

## 2021-05-11 DIAGNOSIS — R0789 Other chest pain: Secondary | ICD-10-CM | POA: Diagnosis not present

## 2021-05-11 DIAGNOSIS — Z0000 Encounter for general adult medical examination without abnormal findings: Secondary | ICD-10-CM | POA: Diagnosis not present

## 2021-05-11 DIAGNOSIS — L409 Psoriasis, unspecified: Secondary | ICD-10-CM | POA: Diagnosis not present

## 2021-05-11 DIAGNOSIS — R7303 Prediabetes: Secondary | ICD-10-CM | POA: Diagnosis not present

## 2021-05-11 DIAGNOSIS — K429 Umbilical hernia without obstruction or gangrene: Secondary | ICD-10-CM | POA: Diagnosis not present

## 2021-05-11 DIAGNOSIS — F411 Generalized anxiety disorder: Secondary | ICD-10-CM | POA: Diagnosis not present

## 2021-05-14 ENCOUNTER — Other Ambulatory Visit: Payer: Self-pay

## 2021-05-14 ENCOUNTER — Ambulatory Visit (LOCAL_COMMUNITY_HEALTH_CENTER): Payer: 59

## 2021-05-14 ENCOUNTER — Telehealth: Payer: Self-pay

## 2021-05-14 VITALS — Wt 173.0 lb

## 2021-05-14 DIAGNOSIS — L409 Psoriasis, unspecified: Secondary | ICD-10-CM

## 2021-05-14 DIAGNOSIS — R7612 Nonspecific reaction to cell mediated immunity measurement of gamma interferon antigen response without active tuberculosis: Secondary | ICD-10-CM

## 2021-05-15 ENCOUNTER — Other Ambulatory Visit: Payer: Self-pay | Admitting: Family Medicine

## 2021-05-15 DIAGNOSIS — R7612 Nonspecific reaction to cell mediated immunity measurement of gamma interferon antigen response without active tuberculosis: Secondary | ICD-10-CM

## 2021-05-15 DIAGNOSIS — L409 Psoriasis, unspecified: Secondary | ICD-10-CM | POA: Insufficient documentation

## 2021-05-15 NOTE — Progress Notes (Signed)
EPI completed via phone. Self referred from dermatology; [facility]. Patient had a +QFT (See Epic in labs) in October 2022 and has not followed up with [facility] until now.  Discussed LTBI vs Active TB, LTBI  tx regimens and need for CXR.   Plans to go for CXR tomorrow morning. Stressed importance for patient to go [facility] the medical mall entrance and not ER. TB RN control will call patient once we receive CXR results and determine if patient wants to proceed with LTBI tx.  He mentioned that he currently uses cream for his Psoriasis because the "pills" were upsetting his stomach.  He thinks his dermatologist wants to do injections in the future to manage his Psoriasis. Richmond Campbell, RN   Language line interpreter

## 2021-05-15 NOTE — Telephone Encounter (Signed)
See EPI on 05/14/21 Aileen Fass, RN

## 2021-05-16 ENCOUNTER — Ambulatory Visit
Admission: RE | Admit: 2021-05-16 | Discharge: 2021-05-16 | Disposition: A | Discharge status: Home / Self Care | Payer: Self-pay | Attending: Family Medicine | Admitting: Family Medicine

## 2021-05-16 ENCOUNTER — Ambulatory Visit
Admission: RE | Admit: 2021-05-16 | Discharge: 2021-05-16 | Disposition: A | Discharge status: Home / Self Care | Payer: Self-pay | Source: Ambulatory Visit | Attending: Family Medicine | Admitting: Family Medicine

## 2021-05-16 DIAGNOSIS — F411 Generalized anxiety disorder: Secondary | ICD-10-CM | POA: Diagnosis not present

## 2021-05-16 DIAGNOSIS — Z111 Encounter for screening for respiratory tuberculosis: Secondary | ICD-10-CM | POA: Diagnosis not present

## 2021-05-16 DIAGNOSIS — R7612 Nonspecific reaction to cell mediated immunity measurement of gamma interferon antigen response without active tuberculosis: Secondary | ICD-10-CM

## 2021-05-16 DIAGNOSIS — Z1389 Encounter for screening for other disorder: Secondary | ICD-10-CM | POA: Diagnosis not present

## 2021-05-18 ENCOUNTER — Telehealth: Payer: Self-pay

## 2021-05-21 ENCOUNTER — Other Ambulatory Visit: Payer: Self-pay

## 2021-05-21 DIAGNOSIS — R7612 Nonspecific reaction to cell mediated immunity measurement of gamma interferon antigen response without active tuberculosis: Secondary | ICD-10-CM

## 2021-05-21 NOTE — Progress Notes (Signed)
Tuberculosis treatment orders  All patients are to be monitored per [place] and county TB policies.   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 Treat for latent TB per the following:  Rifapentine 150mg  (6 tablets) by mouth once weekly x 12 weeks Isoniazid 300mg  (3 tablets) by mouth once weekly x 12 weeks   Draw monthly LFTs Medications and labs have been pre charted for 05/25/21 TB start visit.    +QFT 01/16/2021 Negative HIV 01/04/2021 CXR without Active TB 05/16/2021  Psoriasis- Send TB start letter to [facility]  Fax [phone]

## 2021-05-25 ENCOUNTER — Ambulatory Visit (LOCAL_COMMUNITY_HEALTH_CENTER): Payer: Self-pay

## 2021-05-25 ENCOUNTER — Other Ambulatory Visit: Payer: Self-pay

## 2021-05-25 VITALS — Wt 165.5 lb

## 2021-05-25 DIAGNOSIS — R7612 Nonspecific reaction to cell mediated immunity measurement of gamma interferon antigen response without active tuberculosis: Secondary | ICD-10-CM

## 2021-05-25 MED ORDER — ISONIAZID 300 MG PO TABS: 900.0000 mg | ORAL_TABLET | ORAL | 0 refills | Status: AC

## 2021-05-25 MED ORDER — RIFAPENTINE 150 MG PO TABS: 900.0000 mg | ORAL_TABLET | ORAL | 0 refills | Status: AC

## 2021-05-25 NOTE — Progress Notes (Signed)
In Nurse Clinic for LTBI / TB med Start. Lang line [phone] served as interpreter.   LTBI consent signed INH and Rifapentine info sheets given and reviewed. LTBI vs Active TB sheet given and reviewed.  ROI signed. PCP Letter for TB med start successfully faxed to [facility].  TB coord contact card given. Advised to contact [facility] for questions, concerns, side effects.   INH 300 mg #12 dispensed today with instructions to take 3 tablets by mouth once weekly for 12 weeks. Rifapentine 150 mg #24 tablets dispensed today with instructions to take 6 tablets by mouth once weekly for 12 weeks. TB med order by Dr Levonne Hubert dated 05/21/2021. These instructions explained to pt. He plans to take meds on Friday nights.   RN walked pt to lab for LFT's and CBC. Next TB med appt scheduled 06/22/2021 at 3 pm, has reminder. Jerel Shepherd, RN

## 2021-05-26 LAB — CBC WITH DIFFERENTIAL/PLATELET
Basophils Absolute: 0 10*3/uL (ref 0.0–0.2)
Basos: 1 %
EOS (ABSOLUTE): 0.3 10*3/uL (ref 0.0–0.4)
Eos: 4 %
Hematocrit: 45.6 % (ref 37.5–51.0)
Hemoglobin: 15.6 g/dL (ref 13.0–17.7)
Immature Grans (Abs): 0.1 10*3/uL (ref 0.0–0.1)
Immature Granulocytes: 1 %
Lymphocytes Absolute: 2.2 10*3/uL (ref 0.7–3.1)
Lymphs: 34 %
MCH: 31.4 pg (ref 26.6–33.0)
MCHC: 34.2 g/dL (ref 31.5–35.7)
MCV: 92 fL (ref 79–97)
Monocytes Absolute: 0.4 10*3/uL (ref 0.1–0.9)
Monocytes: 7 %
Neutrophils Absolute: 3.5 10*3/uL (ref 1.4–7.0)
Neutrophils: 53 %
Platelets: 328 10*3/uL (ref 150–450)
RBC: 4.97 x10E6/uL (ref 4.14–5.80)
RDW: 12.3 % (ref 11.6–15.4)
WBC: 6.4 10*3/uL (ref 3.4–10.8)

## 2021-05-26 LAB — HEPATIC FUNCTION PANEL
ALT: 80 IU/L — ABNORMAL HIGH (ref 0–44)
AST: 45 IU/L — ABNORMAL HIGH (ref 0–40)
Albumin: 4.8 g/dL (ref 4.0–5.0)
Alkaline Phosphatase: 99 IU/L (ref 44–121)
Bilirubin Total: 0.9 mg/dL (ref 0.0–1.2)
Bilirubin, Direct: 0.17 mg/dL (ref 0.00–0.40)
Total Protein: 7.1 g/dL (ref 6.0–8.5)

## 2021-05-30 ENCOUNTER — Telehealth: Payer: Self-pay | Admitting: Dermatology

## 2021-05-30 DIAGNOSIS — A159 Respiratory tuberculosis unspecified: Secondary | ICD-10-CM

## 2021-05-30 HISTORY — DX: Respiratory tuberculosis unspecified: A15.9

## 2021-05-30 NOTE — Telephone Encounter (Signed)
Needs interpreter. Please schedule patient for follow-up of Psoriasis in early June. He should have completed his treatment for latent tuberculosis by then.  ? ?In meantime, we can also send in [place] to [facility] if he would like an additional topical to help keep his psoriasis more calm.  ? ?Thank you! ?

## 2021-05-31 NOTE — Telephone Encounter (Signed)
TC with patient. Discussed CXR results and LTBI tx.  Patient wishes to proceed with Rifapentine and INH tx x 12 weeks.  Appt scheduled. Richmond Campbell, RN  ?

## 2021-05-31 NOTE — Telephone Encounter (Signed)
Tried calling patient regarding need for psoriasis follow up. No answer. Used interpreter services. Patient was left message to call office.  ? ? ? ?

## 2021-06-07 DIAGNOSIS — F411 Generalized anxiety disorder: Secondary | ICD-10-CM | POA: Diagnosis not present

## 2021-06-07 DIAGNOSIS — Z1389 Encounter for screening for other disorder: Secondary | ICD-10-CM | POA: Diagnosis not present

## 2021-06-08 DIAGNOSIS — Z1389 Encounter for screening for other disorder: Secondary | ICD-10-CM | POA: Diagnosis not present

## 2021-06-08 DIAGNOSIS — F411 Generalized anxiety disorder: Secondary | ICD-10-CM | POA: Diagnosis not present

## 2021-06-08 DIAGNOSIS — K219 Gastro-esophageal reflux disease without esophagitis: Secondary | ICD-10-CM | POA: Diagnosis not present

## 2021-06-08 DIAGNOSIS — Z0000 Encounter for general adult medical examination without abnormal findings: Secondary | ICD-10-CM | POA: Diagnosis not present

## 2021-06-08 DIAGNOSIS — Z23 Encounter for immunization: Secondary | ICD-10-CM | POA: Diagnosis not present

## 2021-06-08 DIAGNOSIS — K429 Umbilical hernia without obstruction or gangrene: Secondary | ICD-10-CM | POA: Diagnosis not present

## 2021-06-11 NOTE — Telephone Encounter (Signed)
Left message on voicemail to return my call using interpreter services [phone].  ?

## 2021-06-14 ENCOUNTER — Telehealth: Payer: Self-pay

## 2021-06-14 ENCOUNTER — Other Ambulatory Visit: Payer: Self-pay

## 2021-06-14 NOTE — Telephone Encounter (Signed)
09/20/2021 scheduled  interp id [id number] ?

## 2021-06-22 ENCOUNTER — Other Ambulatory Visit: Payer: Self-pay

## 2021-06-22 ENCOUNTER — Ambulatory Visit (LOCAL_COMMUNITY_HEALTH_CENTER): Payer: 59

## 2021-06-22 VITALS — Wt 165.0 lb

## 2021-06-22 DIAGNOSIS — R7612 Nonspecific reaction to cell mediated immunity measurement of gamma interferon antigen response without active tuberculosis: Secondary | ICD-10-CM

## 2021-06-22 MED ORDER — ISONIAZID 300 MG PO TABS: 900.0000 mg | ORAL_TABLET | ORAL | 0 refills | Status: AC

## 2021-06-22 MED ORDER — RIFAPENTINE 150 MG PO TABS: 900.0000 mg | ORAL_TABLET | ORAL | 0 refills | Status: AC

## 2021-06-22 NOTE — Progress Notes (Signed)
In Nurse Clinic for LTBI / TB Med management / Due for #2 INH/ Rifapetine. M [place], interpreter. ? ?Reports no changes since last month except some arm and back pain that started 3 weeks ago after lifting and reaching.  ?Explains he is taking TB meds as prescribed and takes them each Friday. Denies missing any pills. ?Declines INH / Rifapetine med sheets, has them at home. ?TB coord Contact card given. ?Advised to contact [facility] with questions, concerns, side effects. ? ?INH 300 mg #12 dispensed today with instructions to take 3 tablets by mouth once weekly for 12 weeks.  ?Rifapetine 150 mg #24 tablets dispensed today with instructions to take 6 tablets by mouth once weekly for 12 weeks. ?TB med order by Dr Levonne Hubert dated 05/21/2021. ?Instructions explained to pt. He takes his meds on Friday night. ? ?RN walked pt to lab for LFT's. ?Next TB med appt scheduled 07/20/2021 3 pm (arrival 2:45 pm), has reminder.  ?Dr Wyvonnia Lora updated on pt's status. Jerel Shepherd, RN ? ?

## 2021-06-23 LAB — HEPATIC FUNCTION PANEL
ALT: 50 IU/L — ABNORMAL HIGH (ref 0–44)
AST: 34 IU/L (ref 0–40)
Albumin: 4.6 g/dL (ref 4.0–5.0)
Alkaline Phosphatase: 105 IU/L (ref 44–121)
Bilirubin Total: 0.5 mg/dL (ref 0.0–1.2)
Bilirubin, Direct: 0.14 mg/dL (ref 0.00–0.40)
Total Protein: 7.2 g/dL (ref 6.0–8.5)

## 2021-07-20 ENCOUNTER — Ambulatory Visit (LOCAL_COMMUNITY_HEALTH_CENTER): Payer: 59

## 2021-07-20 VITALS — Wt 166.5 lb

## 2021-07-20 DIAGNOSIS — R7612 Nonspecific reaction to cell mediated immunity measurement of gamma interferon antigen response without active tuberculosis: Secondary | ICD-10-CM

## 2021-07-20 MED ORDER — RIFAPENTINE 150 MG PO TABS: 900.0000 mg | ORAL_TABLET | ORAL | 0 refills | Status: AC

## 2021-07-20 MED ORDER — ISONIAZID 300 MG PO TABS: 900.0000 mg | ORAL_TABLET | ORAL | 0 refills | Status: AC

## 2021-07-20 NOTE — Progress Notes (Signed)
Presents for third and final TLTBI appt. TB completion letter provided to client in Spanish and [place] (copy sent for scanning). Also provided with stamped yellow TB completion card. Per client, has no Isoniazid or Rifapentine remaining at home and takes his weekly medicine on Friday. Counseled to stop medication and notify [facility] ASAP if has problems / side effects with the medicine and client verbalized understanding. Medication dispensed per Dr. Wyvonnia Lora order. Jossie Ng, RN ? ?

## 2021-07-21 LAB — HEPATIC FUNCTION PANEL
ALT: 35 IU/L (ref 0–44)
AST: 27 IU/L (ref 0–40)
Albumin: 4.4 g/dL (ref 4.0–5.0)
Alkaline Phosphatase: 105 IU/L (ref 44–121)
Bilirubin Total: 0.4 mg/dL (ref 0.0–1.2)
Bilirubin, Direct: 0.12 mg/dL (ref 0.00–0.40)
Total Protein: 6.8 g/dL (ref 6.0–8.5)

## 2021-09-20 ENCOUNTER — Encounter: Payer: Self-pay | Admitting: Gastroenterology

## 2021-09-20 ENCOUNTER — Ambulatory Visit: Payer: 59 | Admitting: Gastroenterology

## 2021-09-20 VITALS — BP 122/78 | HR 65 | Temp 98.5°F | Ht 61.0 in | Wt 167.0 lb

## 2021-09-20 DIAGNOSIS — Z1211 Encounter for screening for malignant neoplasm of colon: Secondary | ICD-10-CM

## 2021-09-20 DIAGNOSIS — K219 Gastro-esophageal reflux disease without esophagitis: Secondary | ICD-10-CM

## 2021-09-20 MED ORDER — NA SULFATE-K SULFATE-MG SULF 17.5-3.13-1.6 GM/177ML PO SOLN: 1.0000 | Freq: Once | ORAL | 0 refills | Status: AC

## 2021-09-20 NOTE — Addendum Note (Signed)
Addended by: Roena Malady on: 09/20/2021 05:30 PM   Modules accepted: Orders

## 2022-01-14 ENCOUNTER — Encounter: Payer: Self-pay | Admitting: Gastroenterology

## 2022-01-17 NOTE — Anesthesia Preprocedure Evaluation (Addendum)
Anesthesia Evaluation  Patient identified by MRN, date of birth, ID band Patient awake    Reviewed: Allergy & Precautions, NPO status , Patient's Chart, lab work & pertinent test results  Airway Mallampati: II  TM Distance: >3 FB Neck ROM: full    Dental no notable dental hx.    Pulmonary neg pulmonary ROS,    Pulmonary exam normal        Cardiovascular negative cardio ROS Normal cardiovascular exam     Neuro/Psych negative neurological ROS  negative psych ROS   GI/Hepatic Neg liver ROS, GERD  ,  Endo/Other  negative endocrine ROS  Renal/GU negative Renal ROS  negative genitourinary   Musculoskeletal   Abdominal   Peds  Hematology negative hematology ROS (+)   Anesthesia Other Findings Past Medical History: No date: Acid reflux No date: Psoriasis No date: Seasonal allergies 05/2021: Tuberculosis     Comment:  Treatment record (07/30/21) and Letter from Health Dept               (07/20/21) in media  Past Surgical History: No date: HERNIA REPAIR  BMI    Body Mass Index: 31.55 kg/m      Reproductive/Obstetrics negative OB ROS                            Anesthesia Physical Anesthesia Plan  ASA: 2  Anesthesia Plan: General   Post-op Pain Management:    Induction: Intravenous  PONV Risk Score and Plan: Propofol infusion and TIVA  Airway Management Planned: Natural Airway  Additional Equipment:   Intra-op Plan:   Post-operative Plan:   Informed Consent: I have reviewed the patients History and Physical, chart, labs and discussed the procedure including the risks, benefits and alternatives for the proposed anesthesia with the patient or authorized representative who has indicated his/her understanding and acceptance.     Dental Advisory Given and Interpreter used for interveiw  Plan Discussed with: Anesthesiologist, CRNA and Surgeon  Anesthesia Plan Comments:        Anesthesia Quick Evaluation

## 2022-01-18 ENCOUNTER — Ambulatory Visit: Payer: 59 | Admitting: General Practice

## 2022-01-18 ENCOUNTER — Encounter: Payer: Self-pay | Admitting: Gastroenterology

## 2022-01-18 ENCOUNTER — Encounter
Admission: RE | Disposition: A | Discharge status: Home / Self Care | Payer: Self-pay | Source: Ambulatory Visit | Attending: Gastroenterology

## 2022-01-18 ENCOUNTER — Ambulatory Visit
Admission: RE | Admit: 2022-01-18 | Discharge: 2022-01-18 | Disposition: A | Discharge status: Home / Self Care | Payer: 59 | Source: Ambulatory Visit | Attending: Gastroenterology | Admitting: Gastroenterology

## 2022-01-18 ENCOUNTER — Other Ambulatory Visit: Payer: Self-pay

## 2022-01-18 DIAGNOSIS — K219 Gastro-esophageal reflux disease without esophagitis: Secondary | ICD-10-CM | POA: Insufficient documentation

## 2022-01-18 DIAGNOSIS — K317 Polyp of stomach and duodenum: Secondary | ICD-10-CM

## 2022-01-18 DIAGNOSIS — K2289 Other specified disease of esophagus: Secondary | ICD-10-CM | POA: Insufficient documentation

## 2022-01-18 DIAGNOSIS — R12 Heartburn: Secondary | ICD-10-CM

## 2022-01-18 DIAGNOSIS — K648 Other hemorrhoids: Secondary | ICD-10-CM | POA: Insufficient documentation

## 2022-01-18 DIAGNOSIS — Z1211 Encounter for screening for malignant neoplasm of colon: Secondary | ICD-10-CM

## 2022-01-18 DIAGNOSIS — Z8719 Personal history of other diseases of the digestive system: Secondary | ICD-10-CM | POA: Insufficient documentation

## 2022-01-18 DIAGNOSIS — D131 Benign neoplasm of stomach: Secondary | ICD-10-CM | POA: Diagnosis not present

## 2022-01-18 HISTORY — PX: COLONOSCOPY WITH PROPOFOL: SHX5780

## 2022-01-18 HISTORY — PX: POLYPECTOMY: SHX5525

## 2022-01-18 HISTORY — PX: ESOPHAGOGASTRODUODENOSCOPY: SHX5428

## 2022-01-18 SURGERY — COLONOSCOPY WITH PROPOFOL
Anesthesia: General | Site: Rectum

## 2022-01-18 MED ORDER — LACTATED RINGERS IV SOLN: INTRAVENOUS | Status: DC

## 2022-01-18 MED ORDER — STERILE WATER FOR IRRIGATION IR SOLN
Status: DC | PRN
  Administered 2022-01-18: 150 mL

## 2022-01-18 MED ORDER — DEXMEDETOMIDINE HCL IN NACL 80 MCG/20ML IV SOLN
INTRAVENOUS | Status: DC | PRN
  Administered 2022-01-18: 8 ug via BUCCAL

## 2022-01-18 MED ORDER — PROPOFOL 10 MG/ML IV BOLUS
INTRAVENOUS | Status: DC | PRN
  Administered 2022-01-18 (×2): 50 mg via INTRAVENOUS
  Administered 2022-01-18: 200 ug/kg/min via INTRAVENOUS

## 2022-01-18 MED ORDER — SODIUM CHLORIDE 0.9 % IV SOLN: INTRAVENOUS | Status: DC

## 2022-01-18 MED ORDER — LIDOCAINE HCL (CARDIAC) PF 100 MG/5ML IV SOSY
PREFILLED_SYRINGE | INTRAVENOUS | Status: DC | PRN
  Administered 2022-01-18: 100 mg via INTRAVENOUS

## 2022-01-18 SURGICAL SUPPLY — 21 items
CLIP HMST 235XBRD CATH ROT (MISCELLANEOUS) IMPLANT
CLIP RESOLUTION 360 11X235 (MISCELLANEOUS)
ELECT REM PT RETURN 9FT ADLT (ELECTROSURGICAL)
ELECTRODE REM PT RTRN 9FT ADLT (ELECTROSURGICAL) IMPLANT
FORCEPS BIOP RAD 4 LRG CAP 4 (CUTTING FORCEPS) IMPLANT
GOWN CVR UNV OPN BCK APRN NK (MISCELLANEOUS) ×6 IMPLANT
GOWN ISOL THUMB LOOP REG UNIV (MISCELLANEOUS) ×6
INJECTOR VARIJECT VIN23 (MISCELLANEOUS) IMPLANT
KIT DEFENDO VALVE AND CONN (KITS) IMPLANT
KIT PRC NS LF DISP ENDO (KITS) ×3 IMPLANT
KIT PROCEDURE OLYMPUS (KITS) ×3
MANIFOLD NEPTUNE II (INSTRUMENTS) ×3 IMPLANT
MARKER SPOT ENDO TATTOO 5ML (MISCELLANEOUS) IMPLANT
PROBE APC STR FIRE (PROBE) IMPLANT
RETRIEVER NET ROTH 2.5X230 LF (MISCELLANEOUS) IMPLANT
SNARE COLD EXACTO (MISCELLANEOUS) IMPLANT
SNARE SHORT THROW 13M SML OVAL (MISCELLANEOUS) IMPLANT
SNARE SNG USE RND 15MM (INSTRUMENTS) IMPLANT
TRAP ETRAP POLY (MISCELLANEOUS) IMPLANT
VARIJECT INJECTOR VIN23 (MISCELLANEOUS)
WATER STERILE IRR 250ML POUR (IV SOLUTION) ×3 IMPLANT

## 2022-01-18 NOTE — Anesthesia Postprocedure Evaluation (Signed)
Anesthesia Post Note  Patient: Clinton Zuniga  Procedure(s) Performed: COLONOSCOPY WITH PROPOFOL (Rectum) ESOPHAGOGASTRODUODENOSCOPY (EGD) (Mouth) POLYPECTOMY (Mouth)  Patient location during evaluation: PACU Anesthesia Type: General Level of consciousness: awake and alert Pain management: pain level controlled Vital Signs Assessment: post-procedure vital signs reviewed and stable Respiratory status: spontaneous breathing, nonlabored ventilation and respiratory function stable Cardiovascular status: blood pressure returned to baseline and stable Postop Assessment: no apparent nausea or vomiting Anesthetic complications: no   There were no known notable events for this encounter.   Last Vitals:  Vitals:   01/18/22 0945 01/18/22 0958  BP: 97/67 104/69  Pulse: 66 71  Resp: 18 19  Temp:  36.9 C  SpO2: 94% 93%    Last Pain:  Vitals:   01/18/22 0958  TempSrc:   PainSc: 0-No pain                 Iran Ouch

## 2022-01-18 NOTE — H&P (Signed)
   Lucilla Lame, MD [facility] [address], [place], [place] [address] Phone:[phone] Fax : [phone]  Primary Care Physician:  [facility] Primary Gastroenterologist:  Dr. Allen Norris  Pre-Procedure History & Physical: HPI:  Clinton Zuniga is a 46 y.o. male is here for an endoscopy and colonoscopy.   Past Medical History:  Diagnosis Date   Acid reflux    Psoriasis    Seasonal allergies    Tuberculosis 05/2021   Treatment record (07/30/21) and Letter from Health Dept (07/20/21) in media    Past Surgical History:  Procedure Laterality Date   HERNIA REPAIR      Prior to Admission medications   Medication Sig Start Date End Date Taking? Authorizing Provider  cetirizine (ZYRTEC) 10 MG chewable tablet Chew 10 mg by mouth daily.   Yes [provider]  omeprazole (PRILOSEC) 20 MG capsule Take 1 capsule by mouth daily. 02/05/16  Yes [provider]  sertraline (ZOLOFT) 50 MG tablet Take 50 mg by mouth daily.    [provider]  sucralfate (CARAFATE) 1 g tablet Take 1 g by mouth 4 (four) times daily. Patient not taking: Reported on 01/14/2022 05/11/21   [provider]    Allergies as of 09/21/2021 - Review Complete 09/20/2021  Allergen Reaction Noted   Penicillins Nausea And Vomiting 10/12/2014    History reviewed. No pertinent family history.  Social History   Socioeconomic History   Marital status: Single    Spouse name: Not on file   Number of children: Not on file   Years of education: Not on file   Highest education level: Not on file  Occupational History   Not on file  Tobacco Use   Smoking status: Never   Smokeless tobacco: Never  Vaping Use   Vaping Use: Never used  Substance and Sexual Activity   Alcohol use: Not Currently   Drug use: Never   Sexual activity: Not on file  Other Topics Concern   Not on file  Social History Narrative   Not on file   Social Determinants of Health    Financial Resource Strain: Not on file  Food Insecurity: Not on file  Transportation Needs: Not on file  Physical Activity: Not on file  Stress: Not on file  Social Connections: Not on file  Intimate Partner Violence: Not on file    Review of Systems: See HPI, otherwise negative ROS  Physical Exam: BP 131/88   Temp 98.6 F (37 C) (Tympanic)   Resp (!) 24   Ht 5' 0.98" (1.549 m)   Wt 71.1 kg   SpO2 96%   BMI 29.62 kg/m  General:   Alert,  pleasant and cooperative in NAD Head:  Normocephalic and atraumatic. Neck:  Supple; no masses or thyromegaly. Lungs:  Clear throughout to auscultation.    Heart:  Regular rate and rhythm. Abdomen:  Soft, nontender and nondistended. Normal bowel sounds, without guarding, and without rebound.   Neurologic:  Alert and  oriented x4;  grossly normal neurologically.  Impression/Plan: Clinton Zuniga is here for an endoscopy and colonoscopy to be performed for screening and GERD  Risks, benefits, limitations, and alternatives regarding  endoscopy and colonoscopy have been reviewed with the patient.  Questions have been answered.  All parties agreeable.   Lucilla Lame, MD  01/18/2022, 8:59 AM

## 2022-01-18 NOTE — Op Note (Signed)
[facility] Gastroenterology Patient Name: Clinton Zuniga Procedure Date: 01/18/2022 8:59 AM MRN: [id number] Account #: [id number] Date of Birth: 10-Oct-1975 Admit Type: Outpatient Age: 46 Room: [facility] OR ROOM 01 Gender: Male Note Status: Finalized Instrument Name: [id number] Procedure:             Upper GI endoscopy Indications:           Heartburn Providers:             Midge Minium MD, MD Medicines:             Propofol per Anesthesia Complications:         No immediate complications. Procedure:             Pre-Anesthesia Assessment:                        - Prior to the procedure, a History and Physical was                         performed, and patient medications and allergies were                         reviewed. The patient's tolerance of previous                         anesthesia was also reviewed. The risks and benefits                         of the procedure and the sedation options and risks                         were discussed with the patient. All questions were                         answered, and informed consent was obtained. Prior                         Anticoagulants: The patient has taken no previous                         anticoagulant or antiplatelet agents. ASA Grade                         Assessment: II - A patient with mild systemic disease.                         After reviewing the risks and benefits, the patient                         was deemed in satisfactory condition to undergo the                         procedure.                        After obtaining informed consent, the endoscope was                         passed under direct vision. Throughout the procedure,  the patient's blood pressure, pulse, and oxygen                         saturations were monitored continuously. The Endoscope                         was introduced through the mouth, and advanced to the                          second part of duodenum. The upper GI endoscopy was                         accomplished without difficulty. The patient tolerated                         the procedure well. Findings:      The Z-line was irregular and was found at the gastroesophageal junction.      A single medium sessile polyp with no bleeding and no stigmata of recent       bleeding was found in the gastric antrum. Biopsies were taken with a       cold forceps for histology.      The examined duodenum was normal. Impression:            - Z-line irregular, at the gastroesophageal junction.                        - A single gastric polyp. Biopsied.                        - Normal examined duodenum. Recommendation:        - Discharge patient to home.                        - Resume previous diet.                        - Continue present medications.                        - Await pathology results.                        - Perform a colonoscopy today. Procedure Code(s):     --- Professional ---                        [phone], Esophagogastroduodenoscopy, flexible,                         transoral; with biopsy, single or multiple Diagnosis Code(s):     --- Professional ---                        R12, Heartburn                        K31.7, Polyp of stomach and duodenum                        K22.8, Other specified diseases of esophagus CPT copyright 2019 [employer]. All rights reserved. The codes documented  in this report are preliminary and upon coder review may  be revised to meet current compliance requirements. Lucilla Lame MD, MD 01/18/2022 9:13:10 AM This report has been signed electronically. Number of Addenda: 0 Note Initiated On: 01/18/2022 8:59 AM Total Procedure Duration: 0 hours 2 minutes 11 seconds  Estimated Blood Loss:  Estimated blood loss: none.      [facility]

## 2022-01-18 NOTE — Op Note (Signed)
[facility] Gastroenterology Patient Name: Clinton Zuniga Procedure Date: 01/18/2022 8:56 AM MRN: [id number] Account #: [id number] Date of Birth: [birth year] Admit Type: Outpatient Age: 46 Room: [facility] OR ROOM 01 Gender: Male Note Status: Finalized Instrument Name: [id number] Procedure:             Colonoscopy Indications:           Screening for colorectal malignant neoplasm Providers:             Lucilla Lame MD, MD Medicines:             Propofol per Anesthesia Complications:         No immediate complications. Procedure:             Pre-Anesthesia Assessment:                        - Prior to the procedure, a History and Physical was                         performed, and patient medications and allergies were                         reviewed. The patient's tolerance of previous                         anesthesia was also reviewed. The risks and benefits                         of the procedure and the sedation options and risks                         were discussed with the patient. All questions were                         answered, and informed consent was obtained. Prior                         Anticoagulants: The patient has taken no previous                         anticoagulant or antiplatelet agents. ASA Grade                         Assessment: II - A patient with mild systemic disease.                         After reviewing the risks and benefits, the patient                         was deemed in satisfactory condition to undergo the                         procedure.                        After obtaining informed consent, the colonoscope was                         passed under direct vision. Throughout the procedure,  the patient's blood pressure, pulse, and oxygen                         saturations were monitored continuously. The was                         introduced through the anus and advanced to the the                          cecum, identified by appendiceal orifice and ileocecal                         valve. The colonoscopy was performed without                         difficulty. The patient tolerated the procedure well.                         The quality of the bowel preparation was excellent. Findings:      The perianal and digital rectal examinations were normal.      Non-bleeding internal hemorrhoids were found during retroflexion. The       hemorrhoids were Grade I (internal hemorrhoids that do not prolapse).      The exam was otherwise without abnormality. Impression:            - Non-bleeding internal hemorrhoids.                        - The examination was otherwise normal.                        - No specimens collected. Recommendation:        - Discharge patient to home.                        - Resume previous diet.                        - Continue present medications.                        - Repeat colonoscopy in 10 years for screening                         purposes. Procedure Code(s):     --- Professional ---                        [phone], Colonoscopy, flexible; diagnostic, including                         collection of specimen(s) by brushing or washing, when                         performed (separate procedure) Diagnosis Code(s):     --- Professional ---                        Z12.11, Encounter for screening for malignant neoplasm  of colon CPT copyright 2019 American [employer]. All rights reserved. The codes documented in this report are preliminary and upon coder review may  be revised to meet current compliance requirements. Midge Minium MD, MD 01/18/2022 9:28:41 AM This report has been signed electronically. Number of Addenda: 0 Note Initiated On: 01/18/2022 8:56 AM Scope Withdrawal Time: 0 hours 9 minutes 34 seconds  Total Procedure Duration: 0 hours 10 minutes 44 seconds  Estimated Blood Loss:  Estimated blood loss: none.  Estimated blood loss: none.      [facility]

## 2022-01-18 NOTE — Transfer of Care (Signed)
Immediate Anesthesia Transfer of Care Note  Patient: Clinton Zuniga  Procedure(s) Performed: COLONOSCOPY WITH PROPOFOL (Rectum) ESOPHAGOGASTRODUODENOSCOPY (EGD) (Mouth) POLYPECTOMY (Mouth)  Patient Location: PACU  Anesthesia Type: General  Level of Consciousness: awake, alert  and patient cooperative  Airway and Oxygen Therapy: Patient Spontanous Breathing and Patient connected to supplemental oxygen  Post-op Assessment: Post-op Vital signs reviewed, Patient's Cardiovascular Status Stable, Respiratory Function Stable, Patent Airway and No signs of Nausea or vomiting  Post-op Vital Signs: Reviewed and stable  Complications: There were no known notable events for this encounter.

## 2022-01-21 ENCOUNTER — Encounter: Payer: Self-pay | Admitting: Gastroenterology

## 2022-01-22 ENCOUNTER — Encounter: Payer: Self-pay | Admitting: Gastroenterology

## 2022-01-22 LAB — SURGICAL PATHOLOGY

## 2022-08-22 IMAGING — CR DG CHEST 1V
1 series · 1 of 1 positions shown · non-contrast
Comparison: 01/25/2020

CLINICAL DATA: Positive screening test for TB

EXAM:
CHEST  1 VIEW

[w chest pa]
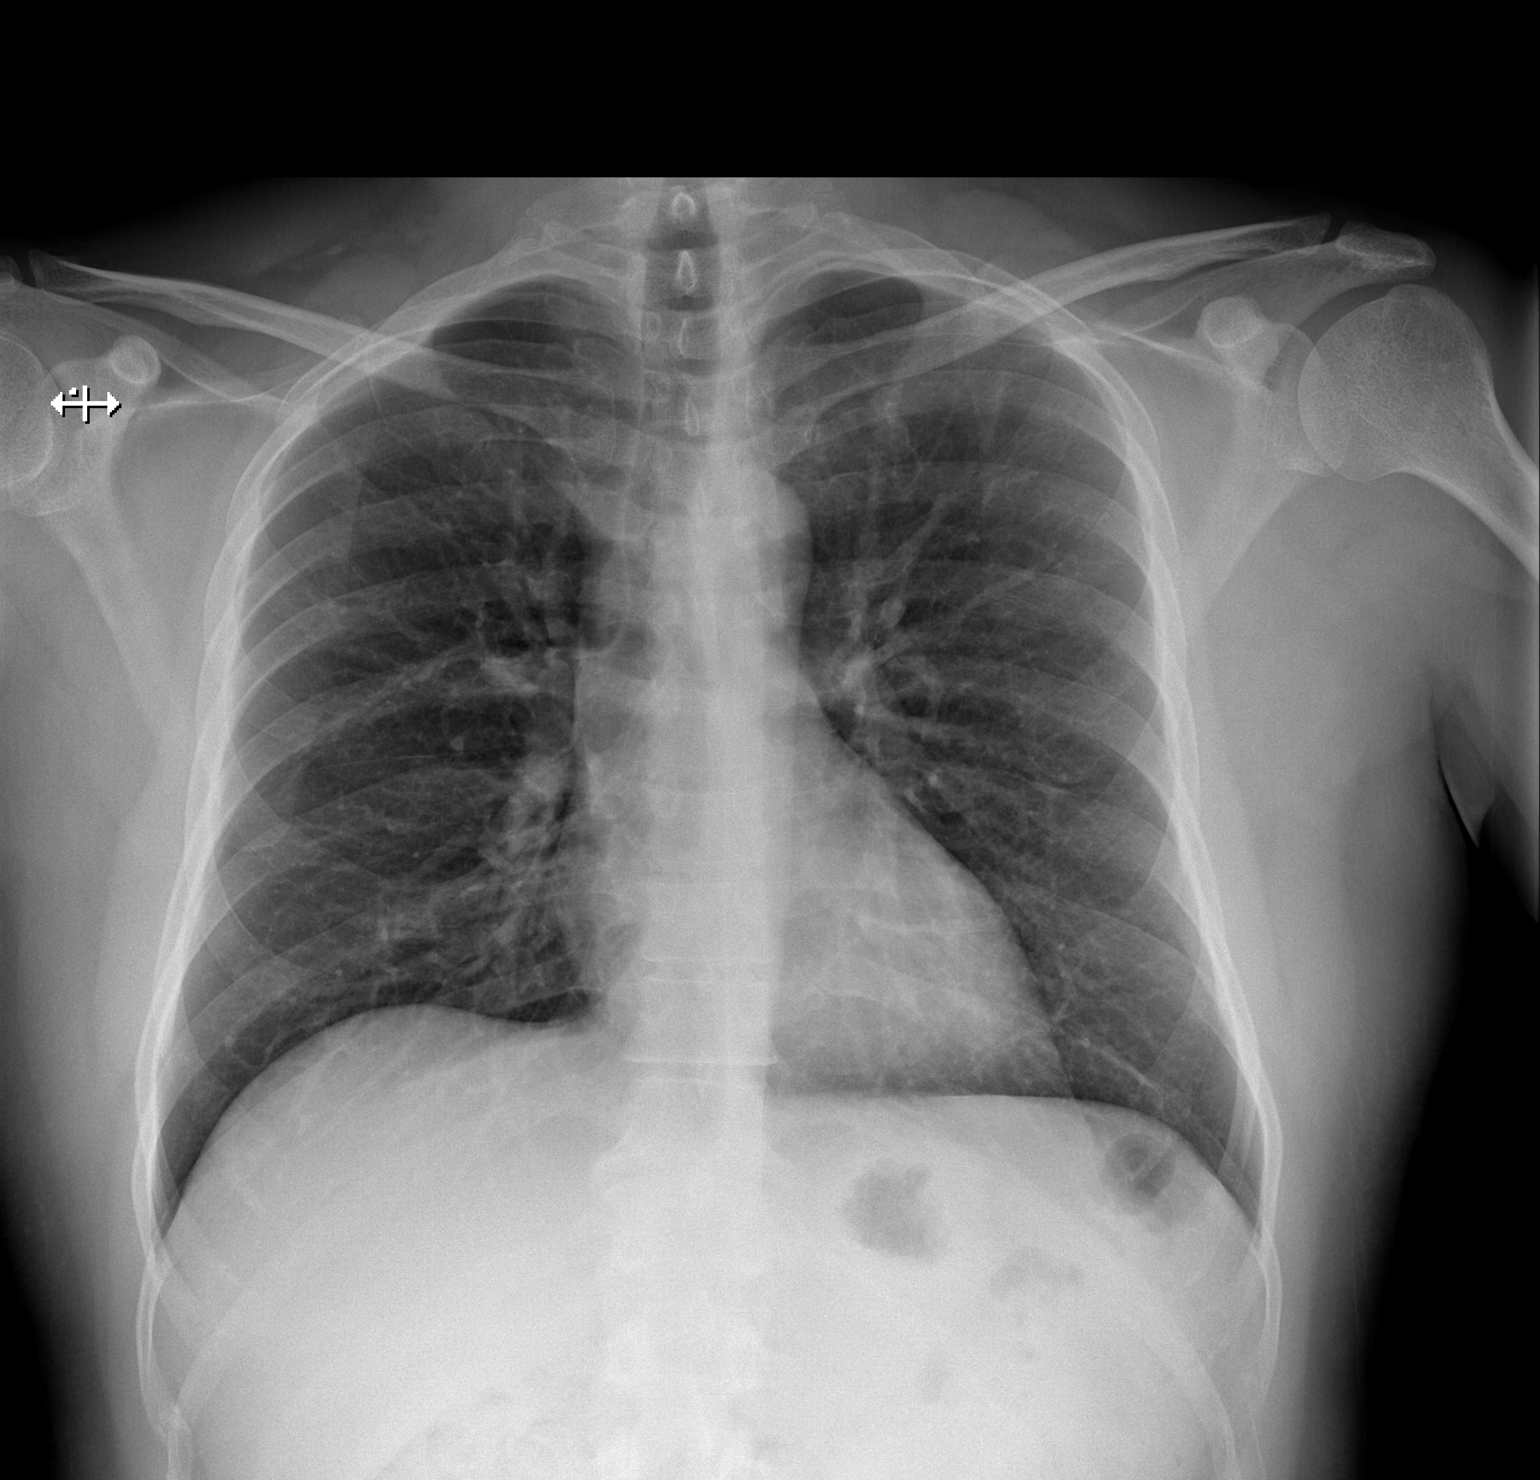

[1 of 1 positions shown; findings below may reference images not displayed]

FINDINGS: The heart size and mediastinal contours are within normal limits.
Both lungs are clear. The visualized skeletal structures are
unremarkable.
IMPRESSION: No active disease.

## 2023-04-18 DIAGNOSIS — Z0000 Encounter for general adult medical examination without abnormal findings: Secondary | ICD-10-CM | POA: Diagnosis not present

## 2023-04-18 DIAGNOSIS — J3089 Other allergic rhinitis: Secondary | ICD-10-CM | POA: Diagnosis not present

## 2023-04-18 DIAGNOSIS — Z13220 Encounter for screening for lipoid disorders: Secondary | ICD-10-CM | POA: Diagnosis not present

## 2023-04-18 DIAGNOSIS — R7303 Prediabetes: Secondary | ICD-10-CM | POA: Diagnosis not present

## 2023-04-18 DIAGNOSIS — Z1389 Encounter for screening for other disorder: Secondary | ICD-10-CM | POA: Diagnosis not present

## 2023-04-18 DIAGNOSIS — L409 Psoriasis, unspecified: Secondary | ICD-10-CM | POA: Diagnosis not present

## 2023-04-18 DIAGNOSIS — Z23 Encounter for immunization: Secondary | ICD-10-CM | POA: Diagnosis not present

## 2023-04-18 DIAGNOSIS — Z1331 Encounter for screening for depression: Secondary | ICD-10-CM | POA: Diagnosis not present

## 2023-04-18 DIAGNOSIS — F411 Generalized anxiety disorder: Secondary | ICD-10-CM | POA: Diagnosis not present

## 2023-05-12 DIAGNOSIS — E782 Mixed hyperlipidemia: Secondary | ICD-10-CM | POA: Diagnosis not present

## 2023-05-12 DIAGNOSIS — R7303 Prediabetes: Secondary | ICD-10-CM | POA: Diagnosis not present

## 2023-06-20 DIAGNOSIS — R945 Abnormal results of liver function studies: Secondary | ICD-10-CM | POA: Diagnosis not present

## 2023-06-20 DIAGNOSIS — E782 Mixed hyperlipidemia: Secondary | ICD-10-CM | POA: Diagnosis not present
